# Patient Record
Sex: Female | Born: 1955 | Race: Black or African American | Hispanic: No | Marital: Single | Smoking: Former smoker
Health system: Southern US, Community
[De-identification: ages and names within clinical notes are randomized; demographics above are authoritative.]

## PROBLEM LIST (undated history)

## (undated) HISTORY — PX: CHOLECYSTECTOMY: SHX55

---

## 2015-10-31 ENCOUNTER — Emergency Department: Payer: Medicare (Managed Care)

## 2015-10-31 ENCOUNTER — Emergency Department
Admission: EM | Admit: 2015-10-31 | Discharge: 2015-10-31 | Disposition: A | Discharge status: Home / Self Care | Payer: Medicare (Managed Care) | Attending: Emergency Medicine | Admitting: Emergency Medicine

## 2015-10-31 ENCOUNTER — Encounter: Payer: Self-pay | Admitting: Emergency Medicine

## 2015-10-31 DIAGNOSIS — Z87891 Personal history of nicotine dependence: Secondary | ICD-10-CM | POA: Diagnosis not present

## 2015-10-31 DIAGNOSIS — Y939 Activity, unspecified: Secondary | ICD-10-CM | POA: Diagnosis not present

## 2015-10-31 DIAGNOSIS — M19011 Primary osteoarthritis, right shoulder: Secondary | ICD-10-CM | POA: Insufficient documentation

## 2015-10-31 DIAGNOSIS — Z79899 Other long term (current) drug therapy: Secondary | ICD-10-CM | POA: Insufficient documentation

## 2015-10-31 DIAGNOSIS — Z791 Long term (current) use of non-steroidal anti-inflammatories (NSAID): Secondary | ICD-10-CM | POA: Diagnosis not present

## 2015-10-31 DIAGNOSIS — X58XXXA Exposure to other specified factors, initial encounter: Secondary | ICD-10-CM | POA: Insufficient documentation

## 2015-10-31 DIAGNOSIS — M1990 Unspecified osteoarthritis, unspecified site: Secondary | ICD-10-CM

## 2015-10-31 DIAGNOSIS — Y929 Unspecified place or not applicable: Secondary | ICD-10-CM | POA: Insufficient documentation

## 2015-10-31 DIAGNOSIS — S46911A Strain of unspecified muscle, fascia and tendon at shoulder and upper arm level, right arm, initial encounter: Secondary | ICD-10-CM | POA: Diagnosis not present

## 2015-10-31 DIAGNOSIS — Y999 Unspecified external cause status: Secondary | ICD-10-CM | POA: Diagnosis not present

## 2015-10-31 DIAGNOSIS — M436 Torticollis: Secondary | ICD-10-CM | POA: Insufficient documentation

## 2015-10-31 DIAGNOSIS — R200 Anesthesia of skin: Secondary | ICD-10-CM | POA: Diagnosis present

## 2015-10-31 LAB — CBC WITH DIFFERENTIAL/PLATELET
BASOS ABS: 0 10*3/uL (ref 0–0.1)
Basophils Relative: 1 %
EOS PCT: 1 %
Eosinophils Absolute: 0.1 10*3/uL (ref 0–0.7)
HEMATOCRIT: 35.6 % (ref 35.0–47.0)
HEMOGLOBIN: 11.5 g/dL — AB (ref 12.0–16.0)
LYMPHS ABS: 3.3 10*3/uL (ref 1.0–3.6)
LYMPHS PCT: 52 %
MCH: 20 pg — AB (ref 26.0–34.0)
MCHC: 32.2 g/dL (ref 32.0–36.0)
MCV: 62.1 fL — AB (ref 80.0–100.0)
Monocytes Absolute: 0.5 10*3/uL (ref 0.2–0.9)
Monocytes Relative: 7 %
NEUTROS ABS: 2.5 10*3/uL (ref 1.4–6.5)
Neutrophils Relative %: 39 %
Platelets: 293 10*3/uL (ref 150–440)
RBC: 5.73 MIL/uL — AB (ref 3.80–5.20)
RDW: 16.9 % — ABNORMAL HIGH (ref 11.5–14.5)
WBC: 6.4 10*3/uL (ref 3.6–11.0)

## 2015-10-31 LAB — COMPREHENSIVE METABOLIC PANEL
ALK PHOS: 79 U/L (ref 38–126)
ALT: 17 U/L (ref 14–54)
AST: 19 U/L (ref 15–41)
Albumin: 4.4 g/dL (ref 3.5–5.0)
Anion gap: 6 (ref 5–15)
BILIRUBIN TOTAL: 0.5 mg/dL (ref 0.3–1.2)
BUN: 14 mg/dL (ref 6–20)
CALCIUM: 9.2 mg/dL (ref 8.9–10.3)
CHLORIDE: 105 mmol/L (ref 101–111)
CO2: 27 mmol/L (ref 22–32)
CREATININE: 0.51 mg/dL (ref 0.44–1.00)
Glucose, Bld: 90 mg/dL (ref 65–99)
Potassium: 3.8 mmol/L (ref 3.5–5.1)
Sodium: 138 mmol/L (ref 135–145)
TOTAL PROTEIN: 9 g/dL — AB (ref 6.5–8.1)

## 2015-10-31 LAB — TROPONIN I

## 2015-10-31 MED ORDER — IBUPROFEN 600 MG PO TABS: 600.0000 mg | ORAL_TABLET | Freq: Three times a day (TID) | ORAL | 0 refills | Status: AC | PRN

## 2015-10-31 MED ORDER — HYDROMORPHONE HCL 1 MG/ML IJ SOLN
0.5000 mg | Freq: Once | INTRAMUSCULAR | Status: AC
  Administered 2015-10-31: 0.5 mg via INTRAVENOUS
  Filled 2015-10-31: qty 1

## 2015-10-31 MED ORDER — DIAZEPAM 5 MG/ML IJ SOLN
5.0000 mg | Freq: Once | INTRAMUSCULAR | Status: AC
  Administered 2015-10-31: 5 mg via INTRAVENOUS
  Filled 2015-10-31: qty 2

## 2015-10-31 MED ORDER — DIAZEPAM 5 MG PO TABS: 5.0000 mg | ORAL_TABLET | Freq: Three times a day (TID) | ORAL | 0 refills | Status: AC | PRN

## 2015-10-31 MED ORDER — KETOROLAC TROMETHAMINE 30 MG/ML IJ SOLN
30.0000 mg | Freq: Once | INTRAMUSCULAR | Status: AC
  Administered 2015-10-31: 30 mg via INTRAVENOUS
  Filled 2015-10-31: qty 1

## 2015-10-31 NOTE — ED Notes (Signed)
Patient transported to CT/Xray. 

## 2015-10-31 NOTE — ED Notes (Signed)
Patient transported to CT 

## 2015-10-31 NOTE — ED Provider Notes (Signed)
[facility] Emergency Department Provider Note        Time seen: ----------------------------------------- 7:06 AM on 10/31/2015 -----------------------------------------    I have reviewed the triage vital signs and the nursing notes.   HISTORY  Chief Complaint Numbness    HPI Maria Small is a 60 y.o. female who presents to ER with complaints of right-sided neck shoulder and arm numbness that started at 9 or 10 last night. 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 She did note she was doing some lifting work at home, is not sure if this caused her pain. She has severe pain with range of motion right shoulder. She has no numbness.   No past medical history on file.  There are no active problems to display for this patient.   No past surgical history on file.  Allergies Review of patient's allergies indicates not on file.  Social History Social History  Substance Use Topics  . Smoking status: Not on file  . Smokeless tobacco: Not on file  . Alcohol use Not on file    Review of Systems Constitutional: Negative for fever. Cardiovascular: Negative for chest pain. Respiratory: Negative for shortness of breath. Gastrointestinal: Negative for abdominal pain, vomiting and diarrhea. Genitourinary: Negative for dysuria. Musculoskeletal: Positive for right shoulder and right neck pain Skin: Negative for rash. Neurological: Negative for headaches, focal weakness or numbness.  10-point ROS otherwise negative.  ____________________________________________   PHYSICAL EXAM:  VITAL SIGNS: ED Triage Vitals [10/31/15 0701]  Enc Vitals Group     BP      Pulse      Resp      Temp      Temp src      SpO2      Weight  210 lb (95.3 kg)     Height 5\' 8"  (1.727 m)     Head Circumference      Peak Flow      Pain Score      Pain Loc      Pain Edu?      Excl. in GC?     Constitutional: Alert and oriented.Mild distress Eyes: Conjunctivae are normal. PERRL. Normal extraocular movements. ENT   Head: Normocephalic and atraumatic.   Nose: No congestion/rhinnorhea.   Mouth/Throat: Mucous membranes are moist.   Neck: No stridor. Cardiovascular: Normal rate, regular rhythm. No murmurs, rubs, or gallops. Respiratory: Normal respiratory effort without tachypnea nor retractions. Breath sounds are clear and equal bilaterally. No wheezes/rales/rhonchi. Gastrointestinal: Soft and nontender. Normal bowel sounds Musculoskeletal: Severe pain range of motion of the right shoulder and manipulation of the rotator cuff. There is right-sided trapezius muscle tenderness, no neurologic deficits are appreciated in the right arm. Good distal pulses. Neurologic:  Normal speech and language. No gross focal neurologic deficits are appreciated. Cranial nerves are intact, strength, sensation are unremarkable. Skin:  Skin is warm, dry and intact. No rash noted. Psychiatric: Mood and affect are normal. Speech and behavior are normal.  ____________________________________________  EKG: Interpreted by me. Sinus rhythm rate of 55 bpm, normal PR interval, normal QRS, normal QT interval. Normal axis.  ____________________________________________  ED COURSE:  Pertinent labs & imaging results that were available during my care of the patient  were reviewed by me and considered in my medical decision making (see chart for details). Clinical Course  Patient presents to the ER in severe pain which seems to be located around the right shoulder. We will obtain basic labs and imaging particularly of the neck and shoulder. She will receive IV Dilaudid for pain.    Procedures ____________________________________________   LABS  (pertinent positives/negatives)  Labs Reviewed  CBC WITH DIFFERENTIAL/PLATELET - Abnormal; Notable for the following:       Result Value   RBC 5.73 (*)    Hemoglobin 11.5 (*)    MCV 62.1 (*)    MCH 20.0 (*)    RDW 16.9 (*)    All other components within normal limits  COMPREHENSIVE METABOLIC PANEL - Abnormal; Notable for the following:    Total Protein 9.0 (*)    All other components within normal limits  TROPONIN I    RADIOLOGY Images were viewed byCT head, C-spine, right shoulder x-rays IMPRESSION: Mild cervical spondylosis.  No acute bony abnormality. IMPRESSION: There is no acute bony abnormality of the right shoulder. There is mild degenerative change of the AC joint.    ___________________________________________  FINAL ASSESSMENT AND PLAN  Right shoulder pain, Muscle strain and spasm, arthritis   Patient with labs and imaging as dictated above. Patient is in no distress but does have significant torticollis and pain around the right shoulder. She was given IV Toradol and Valium. Her labs and imaging are reassuring. She does not have any neurologic deficits, she'll be discharged with similar and encouraged to have close follow-up with her doctor.   Emily FilbertWilliams, Jonathan E, MD   Note: This dictation was prepared with Dragon dictation. Any transcriptional errors that result from this process are unintentional    Emily FilbertJonathan E Williams, MD 10/31/15 [phone]

## 2015-10-31 NOTE — ED Triage Notes (Signed)
Patient presents to the ED with complaint of right sided neck, shoulder and arm numbness that started at approximately 9/10 pm last evening.  Reports now unable to move her right arm.  Patient reports history of problems with her.  Brother who is with patient noticed that her speech seemed to be different today than yesterday.

## 2015-10-31 NOTE — ED Notes (Signed)
Waiting for pt to arrive from CT.

## 2015-10-31 NOTE — ED Notes (Signed)
Patient transported to X-ray 

## 2015-10-31 NOTE — ED Triage Notes (Signed)
Pt started with pain in right shoulder last night at 8 pm. Over night pain got worse radiating to right arm, and neck. This morning unable to move arm r/t pain. Has had some right leg numbness. Speech appears slightly slurred. Pt thinks because was up all night with pain,.

## 2018-03-02 IMAGING — CR DG SHOULDER 2+V*R*
1 series · 4 of 4 positions shown · non-contrast
Comparison: None in PACs

CLINICAL DATA: Onset of right shoulder pain last evening around 8
p.m. with increased symptoms over the night with pain radiating into
the neck and into the arm. Unable to raise the arm today. Also some
right leg numbness.

EXAM:
RIGHT SHOULDER - 2+ VIEW

[Series 1: dg shoulder right · 0.14mm/px · 4 of 4 slices shown]
[im 1/4]
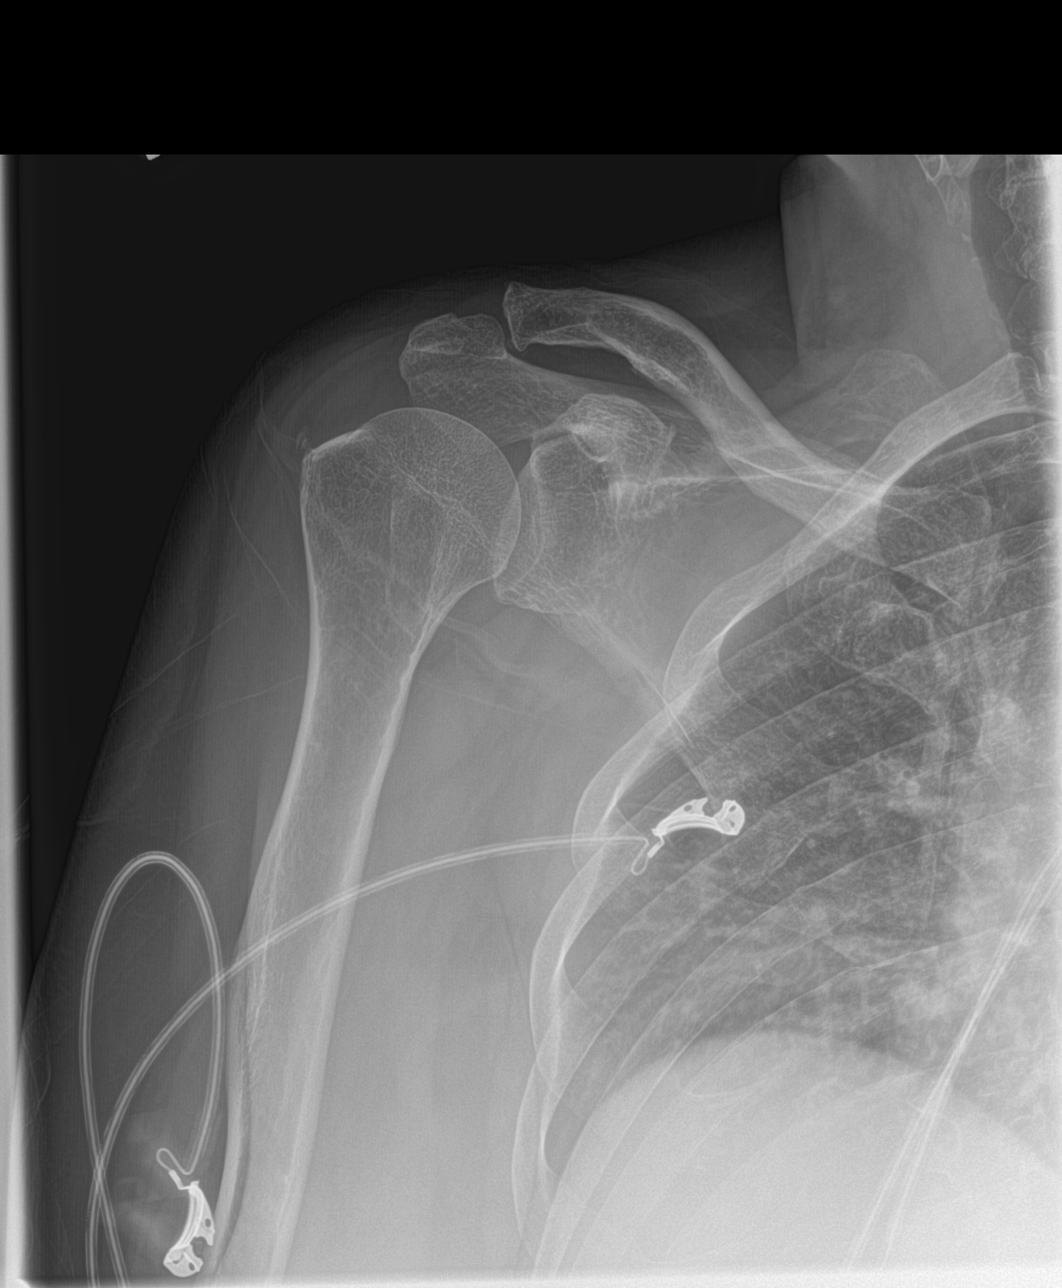
[im 2/4]
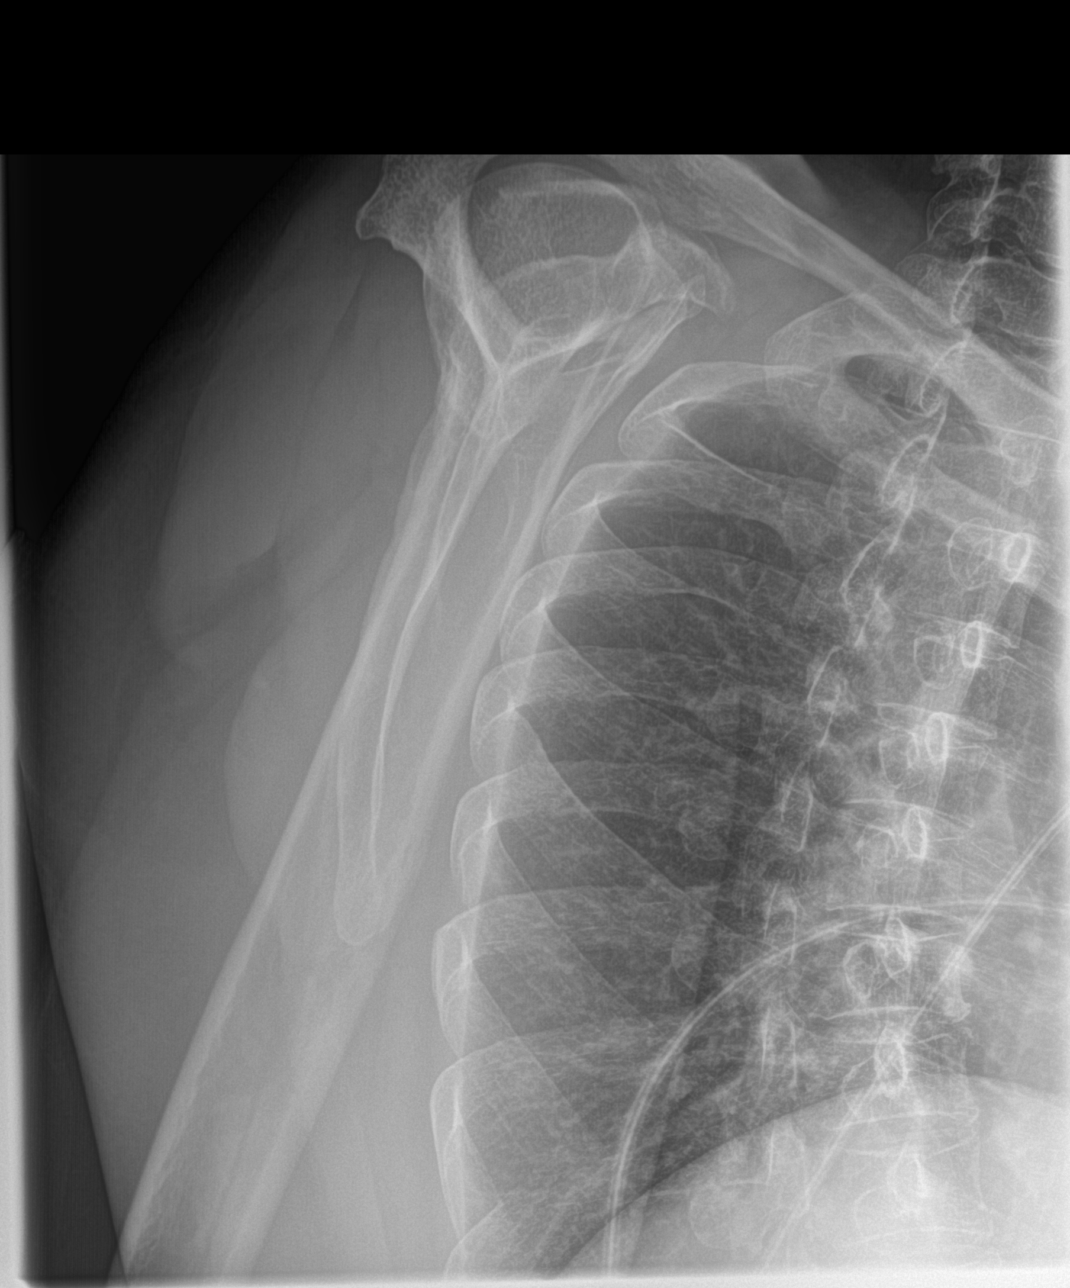
[im 3/4]
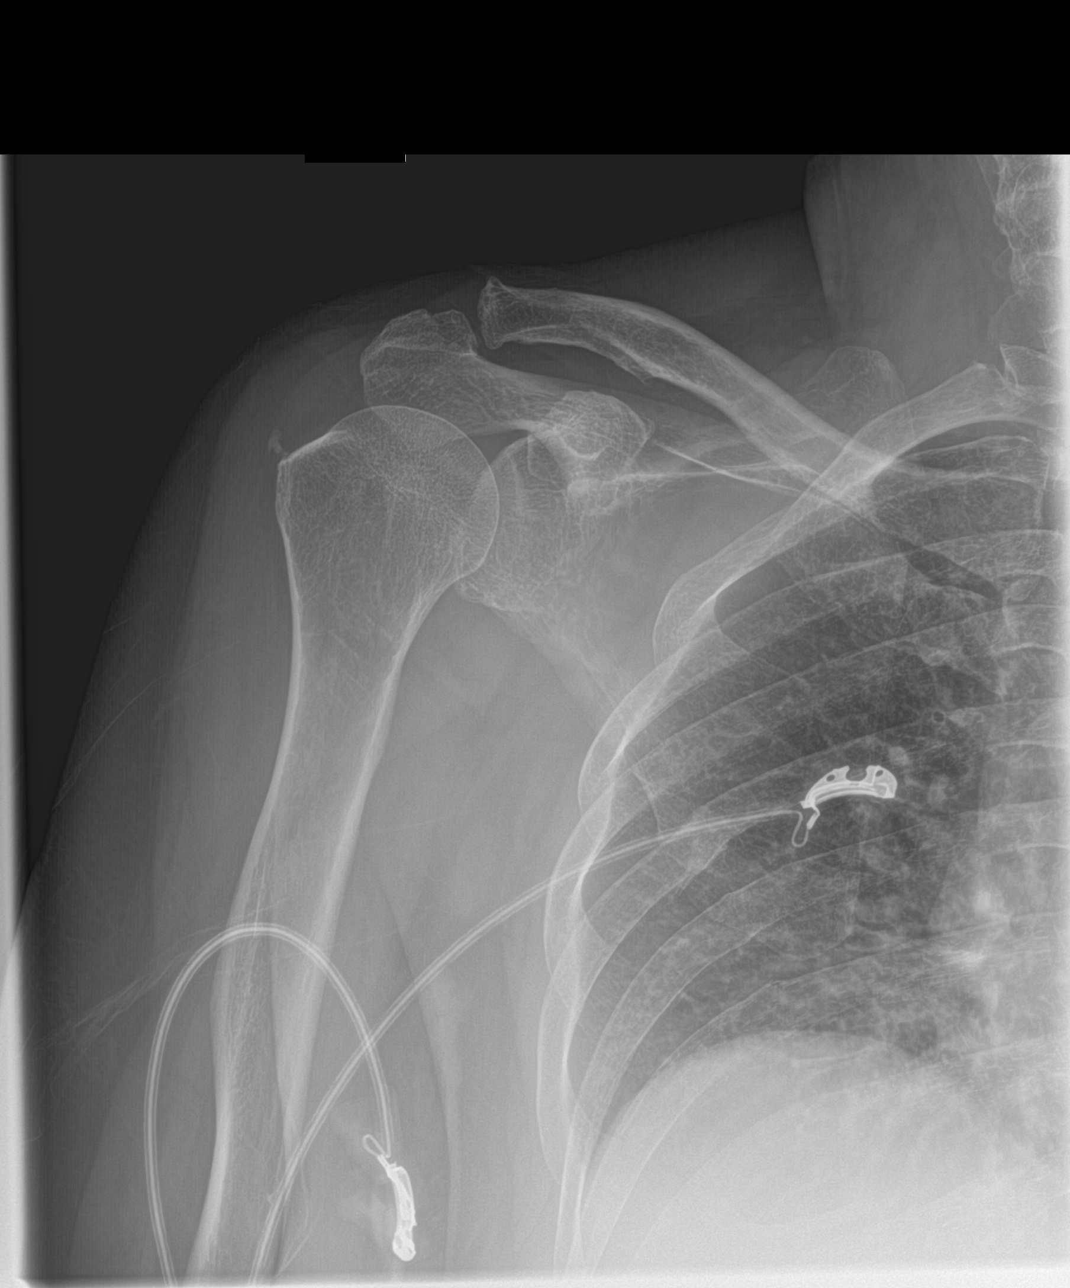
[im 4/4]
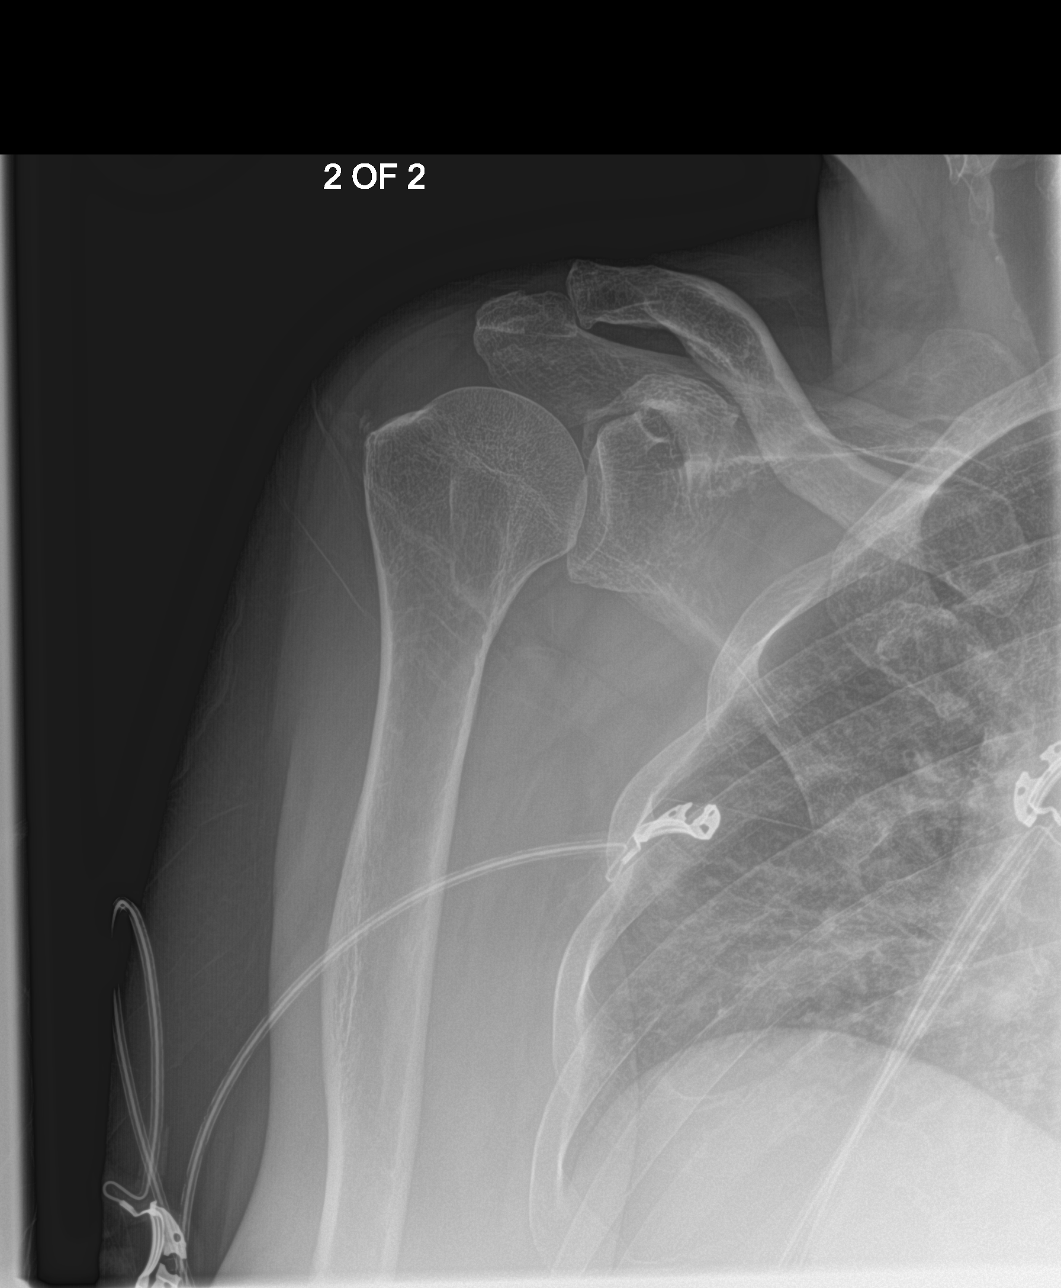

[4 of 4 positions shown; findings below may reference images not displayed]

FINDINGS: The bones subjectively osteopenic. There is no acute or healing
fracture. The glenohumeral and AC joint spaces are reasonably
well-maintained. There small marginal osteophytes arising from the
lateral aspect of the clavicle. The subacromial subdeltoid space is
normal. The observed portions of the upper right ribs are normal.
IMPRESSION: There is no acute bony abnormality of the right shoulder. There is
mild degenerative change of the AC joint.
